# Patient Record
Sex: Male | Born: 1980 | Race: Black or African American | Hispanic: No | Marital: Single | Smoking: Never smoker
Health system: Southern US, Community
[De-identification: ages and names within clinical notes are randomized; demographics above are authoritative.]

---

## 1999-10-11 HISTORY — PX: OTHER SURGICAL HISTORY: SHX169

## 2010-02-20 ENCOUNTER — Emergency Department: Payer: Self-pay | Admitting: Emergency Medicine

## 2012-10-21 ENCOUNTER — Emergency Department: Payer: Self-pay | Admitting: Emergency Medicine

## 2017-11-20 ENCOUNTER — Encounter: Payer: Self-pay | Admitting: Emergency Medicine

## 2017-11-20 ENCOUNTER — Emergency Department: Payer: BLUE CROSS/BLUE SHIELD

## 2017-11-20 DIAGNOSIS — M23611 Other spontaneous disruption of anterior cruciate ligament of right knee: Secondary | ICD-10-CM | POA: Diagnosis not present

## 2017-11-20 DIAGNOSIS — Y999 Unspecified external cause status: Secondary | ICD-10-CM | POA: Insufficient documentation

## 2017-11-20 DIAGNOSIS — Y929 Unspecified place or not applicable: Secondary | ICD-10-CM | POA: Insufficient documentation

## 2017-11-20 DIAGNOSIS — Y9367 Activity, basketball: Secondary | ICD-10-CM | POA: Insufficient documentation

## 2017-11-20 DIAGNOSIS — X501XXA Overexertion from prolonged static or awkward postures, initial encounter: Secondary | ICD-10-CM | POA: Diagnosis not present

## 2017-11-20 DIAGNOSIS — S8991XA Unspecified injury of right lower leg, initial encounter: Secondary | ICD-10-CM | POA: Diagnosis present

## 2017-11-20 NOTE — ED Triage Notes (Signed)
Pt to triage via w/c with no distress noted; reports injuring right knee during bball game PTA; ice pack applied

## 2017-11-21 ENCOUNTER — Emergency Department
Admission: EM | Admit: 2017-11-21 | Discharge: 2017-11-21 | Disposition: A | Discharge status: Home / Self Care | Payer: BLUE CROSS/BLUE SHIELD | Attending: Emergency Medicine | Admitting: Emergency Medicine

## 2017-11-21 ENCOUNTER — Emergency Department: Payer: BLUE CROSS/BLUE SHIELD

## 2017-11-21 DIAGNOSIS — S83511A Sprain of anterior cruciate ligament of right knee, initial encounter: Secondary | ICD-10-CM

## 2017-11-21 MED ORDER — OXYCODONE-ACETAMINOPHEN 7.5-325 MG PO TABS: 1.0000 | ORAL_TABLET | ORAL | 0 refills | Status: AC | PRN

## 2017-11-21 MED ORDER — OXYCODONE-ACETAMINOPHEN 5-325 MG PO TABS
1.0000 | ORAL_TABLET | Freq: Once | ORAL | Status: AC
  Administered 2017-11-21: 1 via ORAL
  Filled 2017-11-21: qty 1

## 2017-11-21 NOTE — ED Notes (Signed)
Report off to jen rn.  

## 2017-11-21 NOTE — ED Notes (Signed)
Patient transported to MRI 

## 2017-11-21 NOTE — ED Notes (Signed)
Pt has pain behind right knee.  States injured knee tonight while playing basketball.    Ice pack applied.

## 2017-11-21 NOTE — ED Provider Notes (Signed)
[facility] Emergency Department Provider Note   First MD Initiated Contact with Patient 11/21/17 0130     (approximate)  I have reviewed the triage vital signs and the nursing notes.   HISTORY  Chief Complaint Knee Injury   HPI Tyler Bell is a 37 y.o. male presents to the emergency department with 10 out of 10 right knee pain status post basketbaLL elated injury.  Patient states that his knee was awkwardly bent backwards (hyperextension) while playing basketball tonight.  Patient states that pain is worse with any movement of the right knee or attempts at ambulation.   Past medical history  None There are no active problems to display for this patient.   Past surgical history None  Prior to Admission medications   Medication Sig Start Date End Date Taking? Authorizing Provider  oxyCODONE-acetaminophen (PERCOCET) 7.5-325 MG tablet Take 1 tablet by mouth every 4 (four) hours as needed for severe pain. 11/21/17 11/21/18  Darci CurrentBrown, [place] N, MD    Allergies No known drug allergies No family history on file.  Social History Social History   Tobacco Use  . Smoking status: Never Smoker  . Smokeless tobacco: Never Used  Substance Use Topics  . Alcohol use: Not on file  . Drug use: Not on file    Review of Systems Constitutional: No fever/chills Eyes: No visual changes. ENT: No sore throat. Cardiovascular: Denies chest pain. Respiratory: Denies shortness of breath. Gastrointestinal: No abdominal pain.  No nausea, no vomiting.  No diarrhea.  No constipation. Genitourinary: Negative for dysuria. Musculoskeletal: Negative for neck pain.  Negative for back pain.  Positive for right knee pain Integumentary: Negative for rash. Neurological: Negative for headaches, focal weakness or numbness.  Allergic/Immunilogical: **}  ____________________________________________   PHYSICAL EXAM:  VITAL SIGNS: ED Triage Vitals  Enc Vitals Group     BP  11/20/17 2139 140/89     Pulse Rate 11/20/17 2139 91     Resp 11/20/17 2139 18     Temp 11/20/17 2139 98.6 F (37 C)     Temp Source 11/20/17 2139 Oral     SpO2 11/20/17 2139 98 %     Weight 11/20/17 2140 119.7 kg (264 lb)     Height 11/20/17 2140 1.956 m (6\' 5" )     Head Circumference --      Peak Flow --      Pain Score 11/20/17 2142 10     Pain Loc --      Pain Edu? --      Excl. in GC? --     Constitutional: Alert and oriented. Well appearing and in no acute distress. Eyes: Conjunctivae are normal.  Head: Atraumatic. Mouth/Throat: Mucous membranes are moist. Oropharynx non-erythematous. Neck: No stridor.   Cardiovascular: Normal rate, regular rhythm. Good peripheral circulation. Grossly normal heart sounds. Respiratory: Normal respiratory effort.  No retractions. Lungs CTAB. Gastrointestinal: Soft and nontender. No distention.  Musculoskeletal: Pain with valgus and varus stress test of the right knee.  Positive anterior drawer test. Neurologic:  Normal speech and language. No gross focal neurologic deficits are appreciated.  Skin:  Skin is warm, dry and intact. No rash noted. Psychiatric: Mood and affect are normal. Speech and behavior are normal.   RADIOLOGY I, [place] N BROWN, personally viewed and evaluated these images (plain radiographs) as part of my medical decision making, as well as reviewing the written report by the radiologist.  ED MD interpretation: Right knee x-ray revealed no acute abnormality  Official radiology report(s): Mr Knee Right Wo Contrast  Result Date: 11/21/2017 CLINICAL DATA:  Right knee injury sustained during basketball game prior to arrival. Lateral knee pain. EXAM: MRI OF THE RIGHT KNEE WITHOUT CONTRAST TECHNIQUE: Multiplanar, multisequence MR imaging of the knee was performed. No intravenous contrast was administered. COMPARISON:  None. FINDINGS: MENISCI Medial meniscus:  Intact Lateral meniscus:  Intact LIGAMENTS Cruciates: Full-thickness  tear the anterior cruciate ligament along its midportion. Intact PCL. Collaterals: Torn LCL with wavy contour of the LCL and adjacent edema. Mild grade 1 sprain of the MCL, slightly thickened in appearance with overlying edema. CARTILAGE Patellofemoral:  No chondral defect. Medial:  Intact Lateral:  Intact Joint: No significant joint effusion. Normal Hoffa's fat pad. No plical thickening. Popliteal Fossa: No significant popliteal fluid collections. Intact popliteus. Extensor Mechanism:  Intact quadriceps tendon and patellar tendon. Bones:  No fracture or significant bone contusion. Other: Subcutaneous soft tissue edema about the knee. IMPRESSION: 1. Full-thickness tear of the ACL and LCL. 2. Grade 1 sprain of the MCL. 3. Intact menisci.  No focal chondral defects. Electronically Signed   By: Tollie Eth M.D.   On: 11/21/2017 03:12   Dg Knee Complete 4 Views Right  Result Date: 11/20/2017 CLINICAL DATA:  Hyperextension injury while playing basketball, with acute onset of right posterior knee pain. Initial encounter. EXAM: RIGHT KNEE - COMPLETE 4+ VIEW COMPARISON:  None. FINDINGS: There is no evidence of fracture or dislocation. The joint spaces are preserved. Minimal wall osteophyte formation is noted; the patellofemoral joint is grossly unremarkable in appearance. An enthesophyte is seen arising at the upper pole of the patella. No significant joint effusion is seen. The visualized soft tissues are normal in appearance. IMPRESSION: No evidence of fracture or dislocation. Electronically Signed   By: Roanna Raider M.D.   On: 11/20/2017 22:20     Procedures   ____________________________________________   INITIAL IMPRESSION / ASSESSMENT AND PLAN / ED COURSE  As part of my medical decision making, I reviewed the following data within the electronic MEDICAL RECORD [id number] year old male presenting to the emergency department above-stated history basketball injury.  Physical exam concerning for ligamentous  injury of the right and as such in the was performed which revealed a full-thickness tear to the right ACL as well as an MCL grade 1 sprain.  Knee immobilizer applied to the right knee, crutches given.  Patient given Percocet.  Spoke with patient at length regarding the necessity of following up with orthopedic surgeon.  Patient will be referred to Dr. Odis Luster ____________________________________________  FINAL CLINICAL IMPRESSION(S) / ED DIAGNOSES  Final diagnoses:  Rupture of anterior cruciate ligament of right knee, initial encounter     MEDICATIONS GIVEN DURING THIS VISIT:  Medications  oxyCODONE-acetaminophen (PERCOCET/ROXICET) 5-325 MG per tablet 1 tablet (1 tablet Oral Given 11/21/17 0122)     ED Discharge Orders        Ordered    oxyCODONE-acetaminophen (PERCOCET) 7.5-325 MG tablet  Every 4 hours PRN     11/21/17 0338       Note:  This document was prepared using Dragon voice recognition software and may include unintentional dictation errors.    Darci Current, MD 11/21/17 [phone]

## 2018-06-21 IMAGING — MR MR KNEE*R* W/O CM
6 series · 37 of 40 positions shown · non-contrast
Comparison: None.

CLINICAL DATA: Right knee injury sustained during basketball game
prior to arrival. Lateral knee pain.

EXAM:
MRI OF THE RIGHT KNEE WITHOUT CONTRAST
TECHNIQUE: Multiplanar, multisequence MR imaging of the knee was performed. No
intravenous contrast was administered.

[Series 3: PD fat-sat · axial · 3.0mm · 0.50mm/px · z∈[-93,+26]mm · 7 of 37 slices shown (1 of 4)]
[im 1/37]
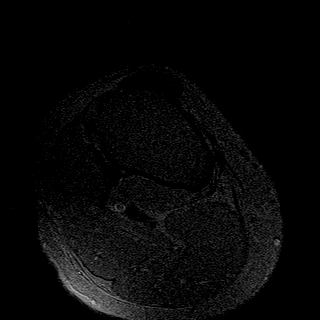
[im 7/37]
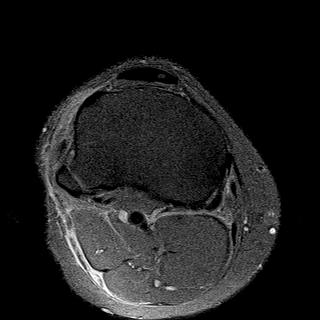
[im 13/37]
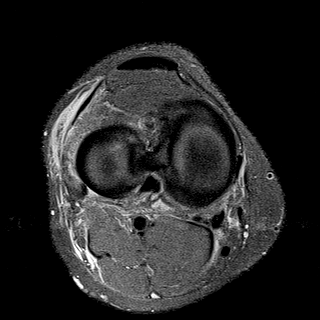
[im 19/37]
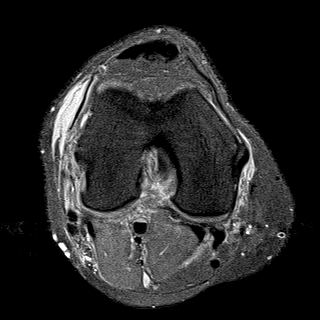
[im 25/37]
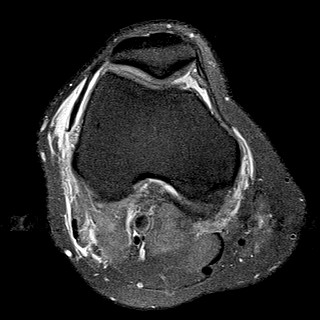
[im 31/37]
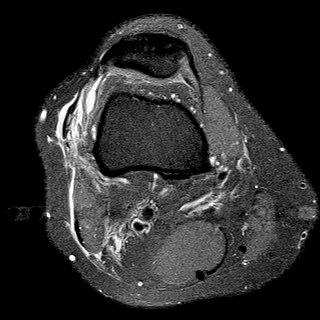
[im 37/37]
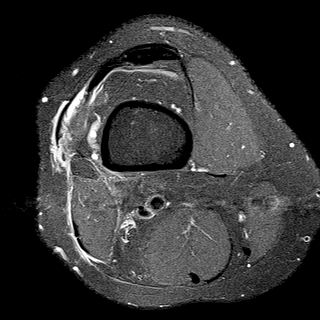

[Series 4: T1 · coronal · 3.0mm · 0.50mm/px · 4 of 30 slices shown]
[im 1/30]
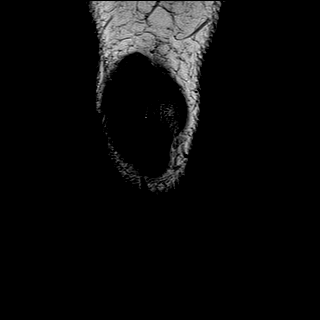
[im 5/30]
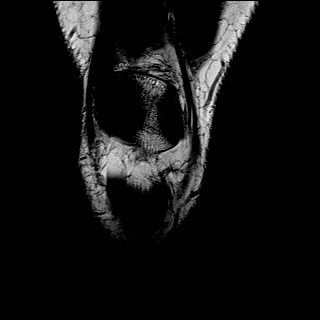
[im 10/30]
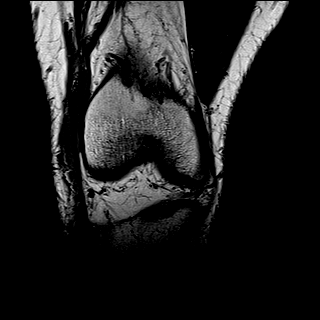
[im 15/30]
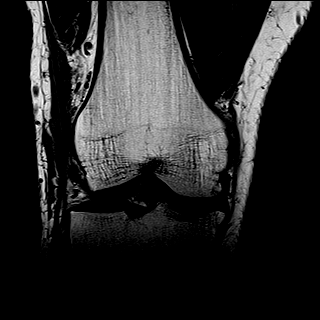

[Series 5: T2 fat-sat · coronal · 3.0mm · 0.31mm/px · 7 of 30 slices shown]
[im 1/30]
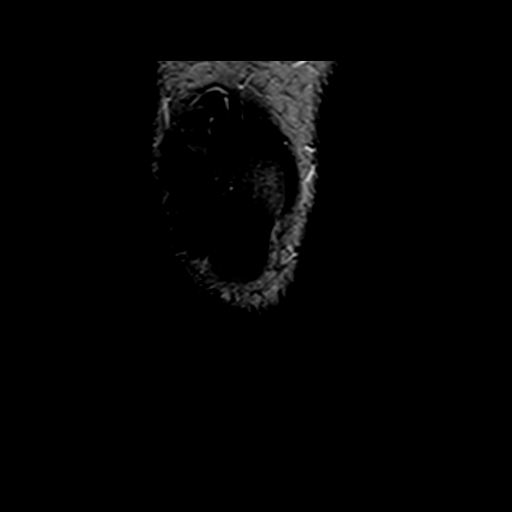
[im 5/30]
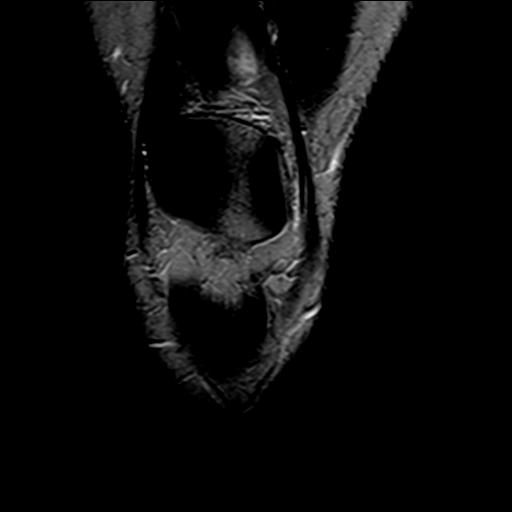
[im 10/30]
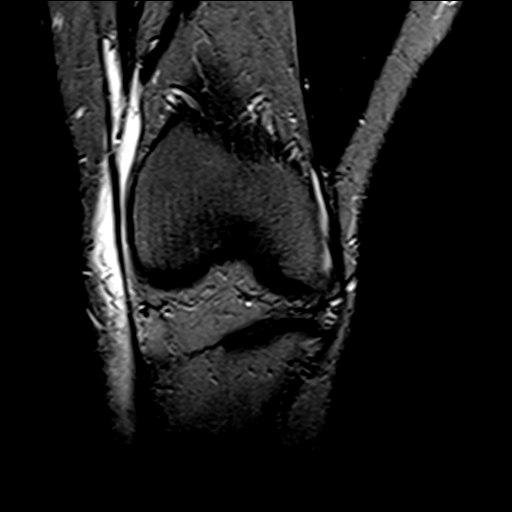
[im 15/30]
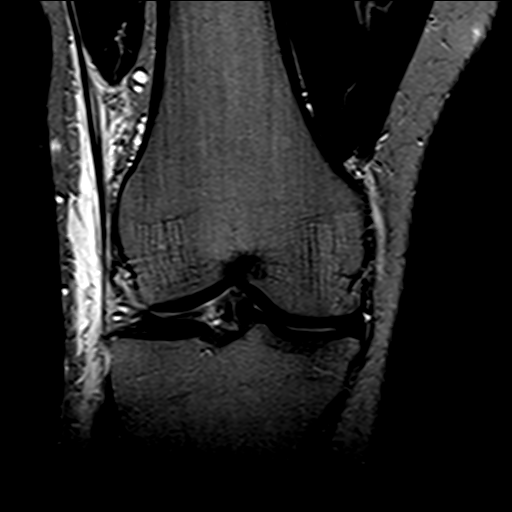
[im 20/30]
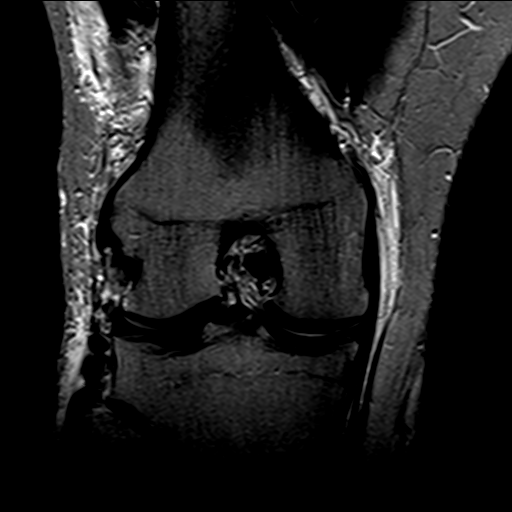
[im 25/30]
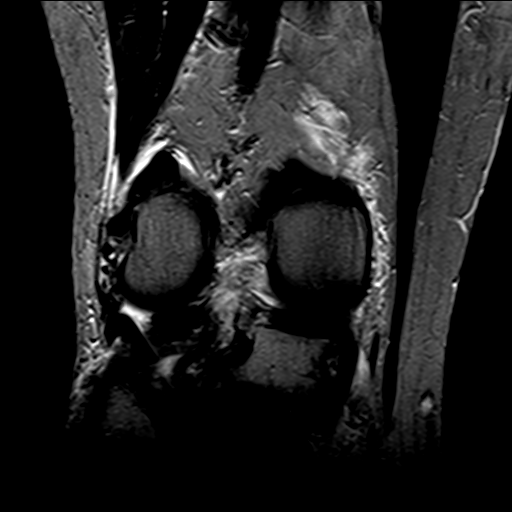
[im 30/30]
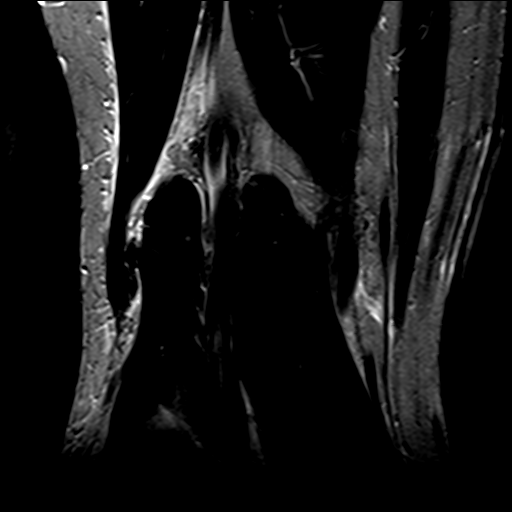

[Series 6: PD fat-sat · coronal · 3.0mm · 0.50mm/px · 7 of 30 slices shown (2 of 4)]
[im 1/30]
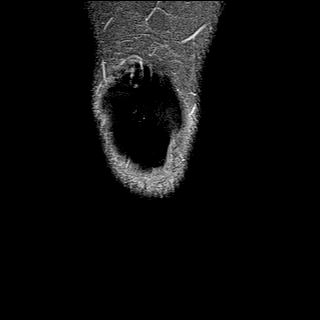
[im 5/30]
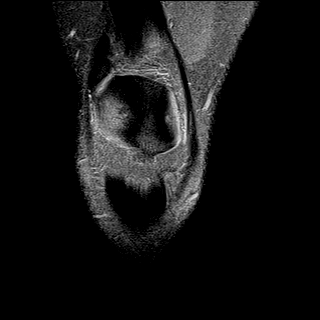
[im 10/30]
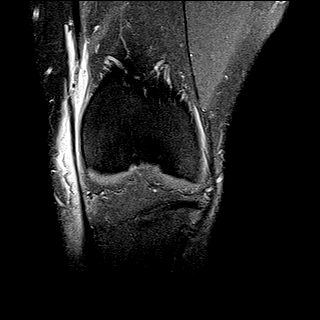
[im 15/30]
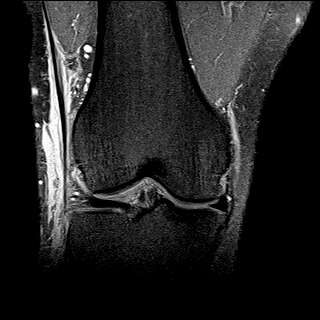
[im 20/30]
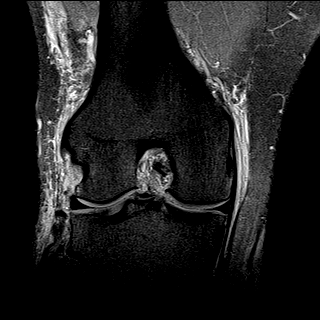
[im 25/30]
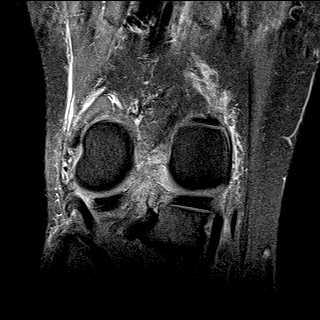
[im 30/30]
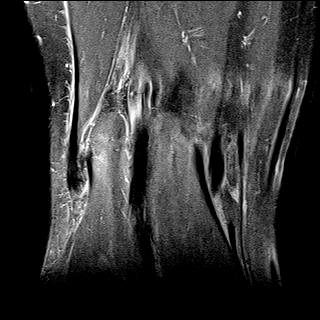

[Series 8: PD fat-sat · axial · 2.0mm · 0.62mm/px · z∈[-100,-71]mm · 4 of 17 slices shown (3 of 4)]
[im 1/17]
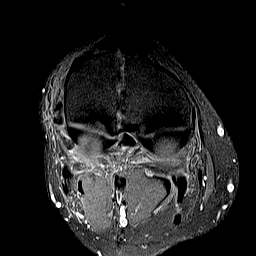
[im 6/17]
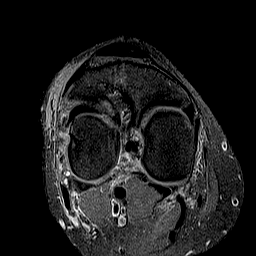
[im 11/17]
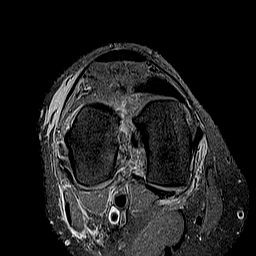
[im 17/17]
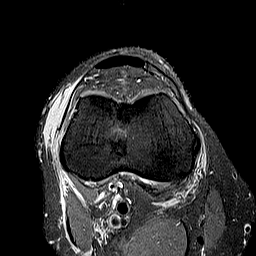

[Series 9: PD fat-sat · sagittal · 3.0mm · 0.50mm/px · 8 of 35 slices shown (4 of 4)]
[im 1/35]
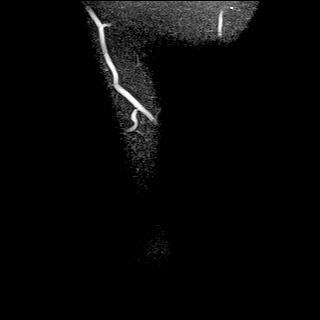
[im 5/35]
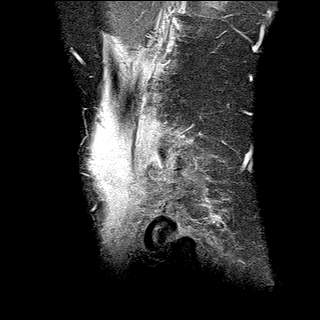
[im 10/35]
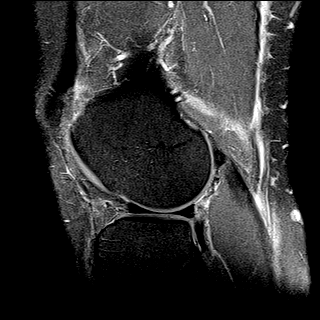
[im 15/35]
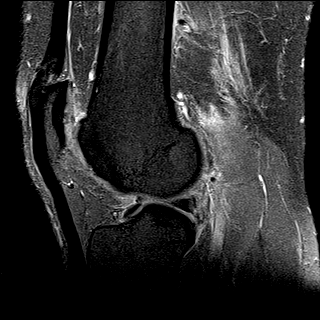
[im 20/35]
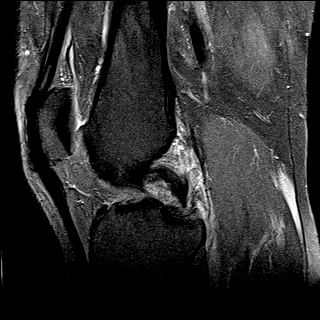
[im 25/35]
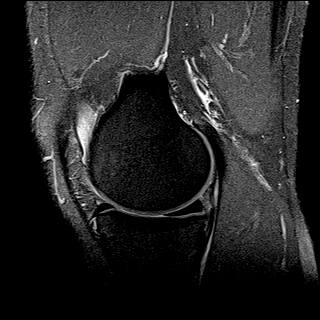
[im 30/35]
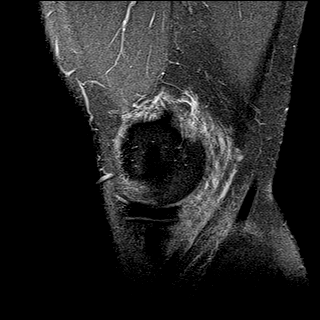
[im 35/35]
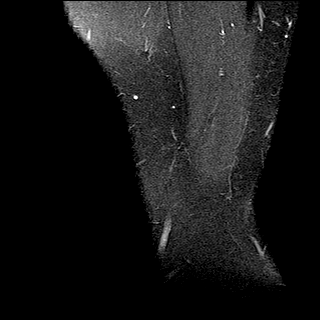

[37 of 40 positions shown; findings below may reference images not displayed]

FINDINGS: MENISCI

Medial meniscus:  Intact

Lateral meniscus:  Intact

LIGAMENTS

Cruciates: Full-thickness tear the anterior cruciate ligament along
its midportion. Intact PCL.

Collaterals: Torn LCL with wavy contour of the LCL and adjacent
edema. Mild grade 1 sprain of the MCL, slightly thickened in
appearance with overlying edema.

CARTILAGE

Patellofemoral:  No chondral defect.

Medial:  Intact

Lateral:  Intact

Joint: No significant joint effusion. Normal Hoffa's fat pad. No
plical thickening.

Popliteal Fossa: No significant popliteal fluid collections. Intact
popliteus.

Extensor Mechanism:  Intact quadriceps tendon and patellar tendon.

Bones:  No fracture or significant bone contusion.

Other: Subcutaneous soft tissue edema about the knee.
IMPRESSION: 1. Full-thickness tear of the ACL and LCL.
2. Grade 1 sprain of the MCL.
3. Intact menisci.  No focal chondral defects.

## 2019-05-28 ENCOUNTER — Other Ambulatory Visit: Payer: Self-pay

## 2019-05-28 DIAGNOSIS — Z20822 Contact with and (suspected) exposure to covid-19: Secondary | ICD-10-CM

## 2019-05-29 LAB — NOVEL CORONAVIRUS, NAA: SARS-CoV-2, NAA: NOT DETECTED

## 2021-03-04 ENCOUNTER — Ambulatory Visit: Payer: Self-pay

## 2021-03-04 ENCOUNTER — Other Ambulatory Visit: Payer: Self-pay

## 2021-03-04 DIAGNOSIS — Z0000 Encounter for general adult medical examination without abnormal findings: Secondary | ICD-10-CM

## 2021-03-04 LAB — POCT URINALYSIS DIPSTICK
Bilirubin, UA: NEGATIVE
Blood, UA: NEGATIVE
Glucose, UA: NEGATIVE
Ketones, UA: NEGATIVE
Leukocytes, UA: NEGATIVE
Nitrite, UA: NEGATIVE
Protein, UA: NEGATIVE
Spec Grav, UA: >=1.03 — AB (ref 1.010–1.025)
Urobilinogen, UA: 0.2 E.U./dL
pH, UA: 5.5 (ref 5.0–8.0)

## 2021-03-04 NOTE — Progress Notes (Signed)
Scheduled to complete physical 03/11/21 with Adam Scarboro, FNP.  AMD 

## 2021-03-05 LAB — CMP12+LP+TP+TSH+6AC+PSA+CBC…
ALT: 121 IU/L — ABNORMAL HIGH (ref 0–44)
AST: 63 IU/L — ABNORMAL HIGH (ref 0–40)
Albumin/Globulin Ratio: 2.1 (ref 1.2–2.2)
Albumin: 5 g/dL (ref 4.0–5.0)
Alkaline Phosphatase: 68 IU/L (ref 44–121)
BUN/Creatinine Ratio: 9 (ref 9–20)
BUN: 12 mg/dL (ref 6–24)
Basophils Absolute: 0 10*3/uL (ref 0.0–0.2)
Basos: 1 %
Bilirubin Total: 0.5 mg/dL (ref 0.0–1.2)
Calcium: 9.9 mg/dL (ref 8.7–10.2)
Chloride: 100 mmol/L (ref 96–106)
Chol/HDL Ratio: 6.4 ratio — ABNORMAL HIGH (ref 0.0–5.0)
Cholesterol, Total: 299 mg/dL — ABNORMAL HIGH (ref 100–199)
Creatinine, Ser: 1.41 mg/dL — ABNORMAL HIGH (ref 0.76–1.27)
EOS (ABSOLUTE): 0.1 10*3/uL (ref 0.0–0.4)
Eos: 2 %
Estimated CHD Risk: 1.3 times avg. — ABNORMAL HIGH (ref 0.0–1.0)
Free Thyroxine Index: 1.7 (ref 1.2–4.9)
GGT: 74 IU/L — ABNORMAL HIGH (ref 0–65)
Globulin, Total: 2.4 g/dL (ref 1.5–4.5)
Glucose: 108 mg/dL — ABNORMAL HIGH (ref 65–99)
HDL: 47 mg/dL (ref >39)
Hematocrit: 42 % (ref 37.5–51.0)
Hemoglobin: 14.2 g/dL (ref 13.0–17.7)
Immature Grans (Abs): 0 10*3/uL (ref 0.0–0.1)
Immature Granulocytes: 0 %
Iron: 83 ug/dL (ref 38–169)
LDH: 253 IU/L — ABNORMAL HIGH (ref 121–224)
LDL Chol Calc (NIH): 228 mg/dL — ABNORMAL HIGH (ref 0–99)
Lymphocytes Absolute: 2.7 10*3/uL (ref 0.7–3.1)
Lymphs: 50 %
MCH: 29.2 pg (ref 26.6–33.0)
MCHC: 33.8 g/dL (ref 31.5–35.7)
MCV: 86 fL (ref 79–97)
Monocytes Absolute: 0.5 10*3/uL (ref 0.1–0.9)
Monocytes: 10 %
Neutrophils Absolute: 2 10*3/uL (ref 1.4–7.0)
Neutrophils: 37 %
Phosphorus: 4.5 mg/dL — ABNORMAL HIGH (ref 2.8–4.1)
Platelets: 185 10*3/uL (ref 150–450)
Potassium: 4.3 mmol/L (ref 3.5–5.2)
Prostate Specific Ag, Serum: 1.2 ng/mL (ref 0.0–4.0)
RBC: 4.86 x10E6/uL (ref 4.14–5.80)
RDW: 14 % (ref 11.6–15.4)
Sodium: 139 mmol/L (ref 134–144)
T3 Uptake Ratio: 25 % (ref 24–39)
T4, Total: 6.8 ug/dL (ref 4.5–12.0)
TSH: 1.5 u[IU]/mL (ref 0.450–4.500)
Total Protein: 7.4 g/dL (ref 6.0–8.5)
Triglycerides: 132 mg/dL (ref 0–149)
Uric Acid: 7.4 mg/dL (ref 3.8–8.4)
VLDL Cholesterol Cal: 24 mg/dL (ref 5–40)
WBC: 5.3 10*3/uL (ref 3.4–10.8)
eGFR: 64 mL/min/{1.73_m2} (ref >59)

## 2021-03-11 ENCOUNTER — Other Ambulatory Visit: Payer: Self-pay

## 2021-03-11 ENCOUNTER — Ambulatory Visit: Payer: Self-pay | Admitting: Adult Health

## 2021-03-11 ENCOUNTER — Encounter: Payer: Self-pay | Admitting: Adult Health

## 2021-03-11 VITALS — BP 136/95 | HR 101 | Temp 97.6°F | Resp 16 | Ht 77.0 in | Wt 262.0 lb

## 2021-03-11 DIAGNOSIS — E7800 Pure hypercholesterolemia, unspecified: Secondary | ICD-10-CM

## 2021-03-11 DIAGNOSIS — Z0000 Encounter for general adult medical examination without abnormal findings: Secondary | ICD-10-CM

## 2021-03-11 DIAGNOSIS — R7989 Other specified abnormal findings of blood chemistry: Secondary | ICD-10-CM

## 2021-03-11 DIAGNOSIS — R7309 Other abnormal glucose: Secondary | ICD-10-CM

## 2021-03-11 LAB — POCT GLYCOSYLATED HEMOGLOBIN (HGB A1C): HbA1c POC (<> result, manual entry): 4.6 % (ref 4.0–5.6)

## 2021-03-11 NOTE — Progress Notes (Signed)
[facility] [place], [place] [address]  Internal MEDICINE  Office Visit Note  Patient Name: Tyler Bell  [id number]  [id number]  Date of Service: 03/11/2021  Chief Complaint  Patient presents with  . Annual Exam    Pt denies any issues or concerns at this time. CL,RMA     HPI Pt is here for routine health maintenance examination. He is a 40 yo AA male who works for the [employer] for the past 16 years.  He is currently single, and has two children.  A 77 year old son, and a 73 year old daughter. He denies tobacco and illicit drug use.  He drinks alcohol occasionally.  His medical surgical history is reviewed and is unremarkable at this time.      Current Medication: No outpatient encounter medications on file as of 03/11/2021.   No facility-administered encounter medications on file as of 03/11/2021.    Surgical History: Past Surgical History:  Procedure Laterality Date  . pinky surgery  2001    Medical History: History reviewed. No pertinent past medical history.  Family History: History reviewed. No pertinent family history.  Social History: Social History   Socioeconomic History  . Marital status: Single    Spouse name: Not on file  . Number of children: Not on file  . Years of education: Not on file  . Highest education level: Not on file  Occupational History  . Not on file  Tobacco Use  . Smoking status: Never Smoker  . Smokeless tobacco: Never Used  Substance and Sexual Activity  . Alcohol use: Yes    Comment: occasionally   . Drug use: Not on file  . Sexual activity: Not on file  Other Topics Concern  . Not on file  Social History Narrative  . Not on file   Social Determinants of Health   Financial Resource Strain: Not on file  Food Insecurity: Not on file  Transportation Needs: Not on file  Physical Activity: Not on file  Stress: Not on file  Social Connections: Not on file      Review of Systems   Constitutional: Negative for activity change, appetite change and fatigue.  HENT: Negative for congestion, sinus pain, trouble swallowing and voice change.   Eyes: Negative for pain, discharge and visual disturbance.  Respiratory: Negative for cough, chest tightness and shortness of breath.   Cardiovascular: Negative for chest pain and leg swelling.  Gastrointestinal: Negative for abdominal distention, abdominal pain, constipation and diarrhea.  Endocrine: Negative for cold intolerance and heat intolerance.  Genitourinary: Negative for difficulty urinating, flank pain, scrotal swelling and testicular pain.  Musculoskeletal: Negative for arthralgias, back pain and neck pain.  Skin: Negative for color change.  Neurological: Negative for dizziness, weakness and headaches.  Hematological: Negative for adenopathy.  Psychiatric/Behavioral: Negative for agitation, confusion and suicidal ideas.     Vital Signs: BP (!) 136/95   Pulse (!) 101   Temp 97.6 F (36.4 C)   Resp 16   Ht _0  (1.956 m)   Wt 262 lb (118.8 kg)   SpO2 98%   BMI 31.07 kg/m    Physical Exam Constitutional:      Appearance: Normal appearance.  HENT:     Head: Normocephalic.     Right Ear: Tympanic membrane normal.     Left Ear: Tympanic membrane normal.     Nose: Nose normal.     Mouth/Throat:     Mouth: Mucous  membranes are moist.     Pharynx: No oropharyngeal exudate or posterior oropharyngeal erythema.  Eyes:     General:        Right eye: No discharge.        Left eye: No discharge.     Extraocular Movements: Extraocular movements intact.     Pupils: Pupils are equal, round, and reactive to light.  Cardiovascular:     Rate and Rhythm: Normal rate and regular rhythm.     Pulses: Normal pulses.     Heart sounds: Normal heart sounds. No murmur heard.   Pulmonary:     Effort: Pulmonary effort is normal. No respiratory distress.     Breath sounds: Normal breath sounds. No wheezing or rhonchi.   Abdominal:     General: Abdomen is flat. Bowel sounds are normal. There is no distension.     Palpations: There is no mass.     Tenderness: There is no abdominal tenderness. There is no guarding.     Hernia: No hernia is present.  Musculoskeletal:        General: No swelling or deformity. Normal range of motion.     Cervical back: Normal range of motion.  Skin:    General: Skin is warm and dry.     Capillary Refill: Capillary refill takes less than 2 seconds.  Neurological:     General: No focal deficit present.     Mental Status: He is alert.     Cranial Nerves: No cranial nerve deficit.     Gait: Gait normal.  Psychiatric:        Mood and Affect: Mood normal.        Behavior: Behavior normal.        Judgment: Judgment normal.      LABS: Recent Results (from the past 2160 hour(s))  CMP12+LP+TP+TSH+6AC+PSA+CBC.     Status: Abnormal   Collection Time: 03/04/21 10:00 AM  Result Value Ref Range   Glucose 108 (H) 65 - 99 mg/dL   Uric Acid 7.4 3.8 - 8.4 mg/dL    Comment:            Therapeutic target for gout patients: <6.0   BUN 12 6 - 24 mg/dL   Creatinine, Ser 1.41 (H) 0.76 - 1.27 mg/dL   eGFR 64 >59 mL/min/1.73   BUN/Creatinine Ratio 9 9 - 20   Sodium 139 134 - 144 mmol/L   Potassium 4.3 3.5 - 5.2 mmol/L   Chloride 100 96 - 106 mmol/L   Calcium 9.9 8.7 - 10.2 mg/dL   Phosphorus 4.5 (H) 2.8 - 4.1 mg/dL   Total Protein 7.4 6.0 - 8.5 g/dL   Albumin 5.0 4.0 - 5.0 g/dL   Globulin, Total 2.4 1.5 - 4.5 g/dL   Albumin/Globulin Ratio 2.1 1.2 - 2.2   Bilirubin Total 0.5 0.0 - 1.2 mg/dL   Alkaline Phosphatase 68 44 - 121 IU/L   LDH 253 (H) 121 - 224 IU/L   AST 63 (H) 0 - 40 IU/L   ALT 121 (H) 0 - 44 IU/L   GGT 74 (H) 0 - 65 IU/L   Iron 83 38 - 169 ug/dL   Cholesterol, Total 299 (H) 100 - 199 mg/dL   Triglycerides 132 0 - 149 mg/dL   HDL 47 >39 mg/dL   VLDL Cholesterol Cal 24 5 - 40 mg/dL   LDL Chol Calc (NIH) 228 (H) 0 - 99 mg/dL   Chol/HDL Ratio 6.4 (H) 0.0 - 5.0 ratio  Comment:                                   T. Chol/HDL Ratio                                             Men  Women                               1/2 Avg.Risk  3.4    3.3                                   Avg.Risk  5.0    4.4                                2X Avg.Risk  9.6    7.1                                3X Avg.Risk 23.4   11.0    Estimated CHD Risk 1.3 (H) 0.0 - 1.0 times avg.    Comment: The CHD Risk is based on the T. Chol/HDL ratio. Other factors affect CHD Risk such as hypertension, smoking, diabetes, severe obesity, and family history of premature CHD.    TSH 1.500 0.450 - 4.500 uIU/mL   T4, Total 6.8 4.5 - 12.0 ug/dL   T3 Uptake Ratio 25 24 - 39 %   Free Thyroxine Index 1.7 1.2 - 4.9   Prostate Specific Ag, Serum 1.2 0.0 - 4.0 ng/mL    Comment: Roche ECLIA methodology. According to the American Urological Association, Serum PSA should decrease and remain at undetectable levels after radical prostatectomy. The AUA defines biochemical recurrence as an initial PSA value 0.2 ng/mL or greater followed by a subsequent confirmatory PSA value 0.2 ng/mL or greater. Values obtained with different assay methods or kits cannot be used interchangeably. Results cannot be interpreted as absolute evidence of the presence or absence of malignant disease.    WBC 5.3 3.4 - 10.8 x10E3/uL   RBC 4.86 4.14 - 5.80 x10E6/uL   Hemoglobin 14.2 13.0 - 17.7 g/dL   Hematocrit 42.0 37.5 - 51.0 %   MCV 86 79 - 97 fL   MCH 29.2 26.6 - 33.0 pg   MCHC 33.8 31.5 - 35.7 g/dL   RDW 14.0 11.6 - 15.4 %   Platelets 185 150 - 450 x10E3/uL   Neutrophils 37 Not Estab. %   Lymphs 50 Not Estab. %   Monocytes 10 Not Estab. %   Eos 2 Not Estab. %   Basos 1 Not Estab. %   Neutrophils Absolute 2.0 1.4 - 7.0 x10E3/uL   Lymphocytes Absolute 2.7 0.7 - 3.1 x10E3/uL   Monocytes Absolute 0.5 0.1 - 0.9 x10E3/uL   EOS (ABSOLUTE) 0.1 0.0 - 0.4 x10E3/uL   Basophils Absolute 0.0 0.0 - 0.2 x10E3/uL   Immature  Granulocytes 0 Not Estab. %   Immature Grans (Abs) 0.0 0.0 - 0.1 x10E3/uL  POCT urinalysis dipstick     Status: Abnormal   Collection Time: 03/04/21 11:15 AM  Result Value Ref Range   Color, UA yellow    Clarity, UA clear    Glucose, UA Negative Negative   Bilirubin, UA negative    Ketones, UA negative    Spec Grav, UA >=1.030 (A) 1.010 - 1.025   Blood, UA negative    pH, UA 5.5 5.0 - 8.0   Protein, UA Negative Negative   Urobilinogen, UA 0.2 0.2 or 1.0 E.U./dL   Nitrite, UA negative    Leukocytes, UA Negative Negative   Appearance dark    Odor      Assessment/Plan: 1. Annual physical exam Up to date on PHM. Glucose was mildly elevated on fasting labs, A1C was done in office and found to be 4.6.    2. Elevated LFTs LFT's are Elevated, patient does report some OTC supplement use for exercise.  However he has not had any in over a month. Liver does not feel enlarged, however a [place] is warranted at this time.  - US Abdomen Limited RUQ (LIVER/GB); Future  3. Elevated cholesterol Discussed elevated cholesterol, and discussed lifestyle modifications since patient is adamant that he does not want to start medications.  He will increase his exercise, and work on a healthier diet.  Return to clinic in 4-6 months for recheck of cholesterol.    General Counseling: Tyler Bell verbalizes understanding of the findings of todays visit and agrees with plan of treatment. I have discussed any further diagnostic evaluation that may be needed or ordered today. We also reviewed his medications today. he has been encouraged to call the office with any questions or concerns that should arise related to todays visit.    Orders Placed This Encounter  Procedures  . US Abdomen Limited RUQ (LIVER/GB)    No orders of the defined types were placed in this encounter.   Total time spent: 40 Minutes  Time spent includes review of chart, medications, test results, and follow up plan with the patient.     Tyler Bell AGNP-C Nurse Practitioner

## 2021-03-15 ENCOUNTER — Telehealth: Payer: Self-pay

## 2021-03-15 NOTE — Telephone Encounter (Signed)
Tyler Bell (Admin Asst) tried to call [facility] with [place] appt information.  No answer & no voice mail to leave a call back message.  AMD

## 2021-03-16 NOTE — Telephone Encounter (Signed)
Tried to call [place] at [phone] on 03/15/21 - no answer & no voice mailbox set up.  Unable to leave a call back message.  Sent email to [facility] requesting he call [facility] to get appt info & details.  Tried to call [place] at [phone] on 03/16/21 at 8:55 am - no answer & no voice mailbox set up.  Unable to leave a call back message.  Doesn't have MyChart set-up.  J Wellons (Admin Asst) tried to call [place] 2x.  She also states he told her he doesn't check his city emails.  AMD

## 2021-04-07 ENCOUNTER — Ambulatory Visit: Payer: 59

## 2022-04-01 ENCOUNTER — Other Ambulatory Visit: Payer: Self-pay
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
## 2022-04-05 ENCOUNTER — Ambulatory Visit: Payer: Self-pay

## 2022-04-05 DIAGNOSIS — Z0000 Encounter for general adult medical examination without abnormal findings: Secondary | ICD-10-CM

## 2022-04-06 LAB — CMP12+LP+TP+TSH+6AC+PSA+CBC…
ALT: 51 IU/L — ABNORMAL HIGH (ref 0–44)
AST: 40 IU/L (ref 0–40)
Albumin/Globulin Ratio: 2.2 (ref 1.2–2.2)
Albumin: 5 g/dL (ref 4.0–5.0)
Alkaline Phosphatase: 56 IU/L (ref 44–121)
BUN/Creatinine Ratio: 9 (ref 9–20)
BUN: 12 mg/dL (ref 6–24)
Basophils Absolute: 0 10*3/uL (ref 0.0–0.2)
Basos: 1 %
Bilirubin Total: 1 mg/dL (ref 0.0–1.2)
Calcium: 10 mg/dL (ref 8.7–10.2)
Chloride: 98 mmol/L (ref 96–106)
Chol/HDL Ratio: 5.1 ratio — ABNORMAL HIGH (ref 0.0–5.0)
Cholesterol, Total: 228 mg/dL — ABNORMAL HIGH (ref 100–199)
Creatinine, Ser: 1.41 mg/dL — ABNORMAL HIGH (ref 0.76–1.27)
EOS (ABSOLUTE): 0.1 10*3/uL (ref 0.0–0.4)
Eos: 1 %
Estimated CHD Risk: 1 times avg. (ref 0.0–1.0)
Free Thyroxine Index: 1.7 (ref 1.2–4.9)
GGT: 73 IU/L — ABNORMAL HIGH (ref 0–65)
Globulin, Total: 2.3 g/dL (ref 1.5–4.5)
Glucose: 107 mg/dL — ABNORMAL HIGH (ref 70–99)
HDL: 45 mg/dL (ref >39)
Hematocrit: 43.3 % (ref 37.5–51.0)
Hemoglobin: 14.9 g/dL (ref 13.0–17.7)
Immature Grans (Abs): 0 10*3/uL (ref 0.0–0.1)
Immature Granulocytes: 0 %
Iron: 120 ug/dL (ref 38–169)
LDH: 242 IU/L — ABNORMAL HIGH (ref 121–224)
LDL Chol Calc (NIH): 142 mg/dL — ABNORMAL HIGH (ref 0–99)
Lymphocytes Absolute: 2.6 10*3/uL (ref 0.7–3.1)
Lymphs: 48 %
MCH: 29 pg (ref 26.6–33.0)
MCHC: 34.4 g/dL (ref 31.5–35.7)
MCV: 84 fL (ref 79–97)
Monocytes Absolute: 0.5 10*3/uL (ref 0.1–0.9)
Monocytes: 10 %
Neutrophils Absolute: 2.1 10*3/uL (ref 1.4–7.0)
Neutrophils: 40 %
Phosphorus: 4.4 mg/dL — ABNORMAL HIGH (ref 2.8–4.1)
Platelets: 197 10*3/uL (ref 150–450)
Potassium: 3.9 mmol/L (ref 3.5–5.2)
Prostate Specific Ag, Serum: 1.5 ng/mL (ref 0.0–4.0)
RBC: 5.13 x10E6/uL (ref 4.14–5.80)
RDW: 14.6 % (ref 11.6–15.4)
Sodium: 141 mmol/L (ref 134–144)
T3 Uptake Ratio: 24 % (ref 24–39)
T4, Total: 7.1 ug/dL (ref 4.5–12.0)
TSH: 2.8 u[IU]/mL (ref 0.450–4.500)
Total Protein: 7.3 g/dL (ref 6.0–8.5)
Triglycerides: 228 mg/dL — ABNORMAL HIGH (ref 0–149)
Uric Acid: 8.3 mg/dL (ref 3.8–8.4)
VLDL Cholesterol Cal: 41 mg/dL — ABNORMAL HIGH (ref 5–40)
WBC: 5.2 10*3/uL (ref 3.4–10.8)
eGFR: 63 mL/min/{1.73_m2} (ref >59)

## 2022-04-07 ENCOUNTER — Ambulatory Visit: Payer: Self-pay | Admitting: Physician Assistant

## 2022-04-07 ENCOUNTER — Encounter: Payer: Self-pay | Admitting: Physician Assistant

## 2022-04-07 VITALS — BP 153/120 | HR 88 | Temp 98.0°F | Resp 16 | Ht 77.0 in | Wt 259.0 lb

## 2022-04-07 DIAGNOSIS — E7800 Pure hypercholesterolemia, unspecified: Secondary | ICD-10-CM

## 2022-04-07 DIAGNOSIS — H6122 Impacted cerumen, left ear: Secondary | ICD-10-CM

## 2022-04-07 DIAGNOSIS — Z0000 Encounter for general adult medical examination without abnormal findings: Secondary | ICD-10-CM

## 2022-04-07 DIAGNOSIS — R030 Elevated blood-pressure reading, without diagnosis of hypertension: Secondary | ICD-10-CM

## 2022-04-07 LAB — POCT URINALYSIS DIPSTICK
Bilirubin, UA: NEGATIVE
Blood, UA: NEGATIVE
Glucose, UA: NEGATIVE
Ketones, UA: NEGATIVE
Leukocytes, UA: NEGATIVE
Nitrite, UA: NEGATIVE
Protein, UA: POSITIVE — AB
Spec Grav, UA: 1.025 (ref 1.010–1.025)
Urobilinogen, UA: 0.2 E.U./dL
pH, UA: 6 (ref 5.0–8.0)

## 2022-04-07 NOTE — Progress Notes (Signed)
[facility]  ____________________________________________   None    (approximate)  I have reviewed the triage vital signs and the nursing notes.   HISTORY  Chief Complaint No chief complaint on file.    HPI Tyler Bell is a 41 y.o. male patient presents for annual physical exam.  Patient complaining of left ear pain and decreased hearing for 3 to 4 days.  Denies vertigo.         No past medical history on file.  There are no problems to display for this patient.   Past Surgical History:  Procedure Laterality Date   pinky surgery  2001    Prior to Admission medications   Not on File    Allergies Patient has no known allergies.  No family history on file.  Social History Social History   Tobacco Use   Smoking status: Never   Smokeless tobacco: Never  Substance Use Topics   Alcohol use: Yes    Comment: occasionally     Review of Systems Constitutional: No fever/chills Eyes: No visual changes. ENT: No sore throat.  Decreased hearing left ear Cardiovascular: Denies chest pain. Respiratory: Denies shortness of breath. Gastrointestinal: No abdominal pain.  No nausea, no vomiting.  No diarrhea.  No constipation. Genitourinary: Negative for dysuria. Musculoskeletal: Negative for back pain. Skin: Negative for rash. Neurological: Negative for headaches, focal weakness or numbness. Endocrine: Hyperlipidemia  ____________________________________________   PHYSICAL EXAM:  VITAL SIGNS: BP is 153/120, pulse 88, respirations 16, temperature 98, patient is 98% O2 sat on room air.  Patient weighs 2 at 59 pounds and BMI is 30.71. Constitutional: Alert and oriented. Well appearing and in no acute distress. Eyes: Conjunctivae are normal. PERRL. EOMI. EARS: Cerumen impaction left ear. Head: Atraumatic. Nose: No congestion/rhinnorhea. Mouth/Throat: Mucous membranes are moist.  Oropharynx non-erythematous. Neck: No stridor.   No cervical spine tenderness to palpation. Hematological/Lymphatic/Immunilogical: No cervical lymphadenopathy. Cardiovascular: Elevated blood pressure.  Normal rate, regular rhythm. Grossly normal heart sounds.  Good peripheral circulation. Respiratory: Normal respiratory effort.  No retractions. Lungs CTAB. Gastrointestinal: Soft and nontender. No distention. No abdominal bruits. No CVA tenderness. Genitourinary: Deferred Musculoskeletal: No lower extremity tenderness nor edema.  No joint effusions. Neurologic:  Normal speech and language. No gross focal neurologic deficits are appreciated. No gait instability. Skin:  Skin is warm, dry and intact. No rash noted. Psychiatric: Mood and affect are normal. Speech and behavior are normal.  ____________________________________________   LABS      Component Ref Range & Units 2 d ago 1 yr ago  Glucose 70 - 99 mg/dL 107 High   108 High  R   Uric Acid 3.8 - 8.4 mg/dL 8.3  7.4 CM   Comment:            Therapeutic target for gout patients: <6.0  BUN 6 - 24 mg/dL 12  12   Creatinine, Ser 0.76 - 1.27 mg/dL 1.41 High   1.41 High    eGFR >59 mL/min/1.73 63  64   BUN/Creatinine Ratio 9 - '20 9  9   ' Sodium 134 - 144 mmol/L 141  139   Potassium 3.5 - 5.2 mmol/L 3.9  4.3   Chloride 96 - 106 mmol/L 98  100   Calcium 8.7 - 10.2 mg/dL 10.0  9.9   Phosphorus 2.8 - 4.1 mg/dL 4.4 High   4.5 High    Total Protein 6.0 - 8.5 g/dL 7.3  7.4   Albumin 4.0 - 5.0  g/dL 5.0  5.0   Comment:                **Effective April 18, 2022 Albumin reference interval**                   will be changing to:                              Age                  Male          Male                             0 -   7 days       3.6 - 4.9      3.6 - 4.9                             8 -  30 days       3.5 - 4.6      3.5 - 4.6                             1 -   6 months     3.7 - 4.8      3.7 - 4.8                      7 months -   2 years      4.0 - 5.0      4.0 - 5.0                              3 -   5 years      4.1 - 5.0      4.1 - 5.0                             6 -  12 years      4.2 - 5.0      4.2 - 5.0                            13 -  30 years      4.3 - 5.2      4.0 - 5.0                            31 -  50 years      4.1 - 5.1      3.9 - 4.9                            51 -  60 years      3.8 - 4.9      3.8 - 4.9                            61 -  70 years      3.9 - 4.9      3.9 - 4.9  71 -  80 years      3.8 - 4.8      3.8 - 4.8                            81 -  89 years      3.7 - 4.7      3.7 - 4.7                            90 - 199 years      3.6 - 4.6      3.6 - 4.6   Globulin, Total 1.5 - 4.5 g/dL 2.3  2.4   Albumin/Globulin Ratio 1.2 - 2.2 2.2  2.1   Bilirubin Total 0.0 - 1.2 mg/dL 1.0  0.5   Alkaline Phosphatase 44 - 121 IU/L 56  68   LDH 121 - 224 IU/L 242 High   253 High    AST 0 - 40 IU/L 40  63 High    ALT 0 - 44 IU/L 51 High   121 High    GGT 0 - 65 IU/L 73 High   74 High    Iron 38 - 169 ug/dL 120  83   Cholesterol, Total 100 - 199 mg/dL 228 High   299 High    Triglycerides 0 - 149 mg/dL 228 High   132   HDL >39 mg/dL 45  47   VLDL Cholesterol Cal 5 - 40 mg/dL 41 High   24   LDL Chol Calc (NIH) 0 - 99 mg/dL 142 High   228 High    Chol/HDL Ratio 0.0 - 5.0 ratio 5.1 High   6.4 High  CM   Comment:                                   T. Chol/HDL Ratio                                              Men  Women                                1/2 Avg.Risk  3.4    3.3                                    Avg.Risk  5.0    4.4                                 2X Avg.Risk  9.6    7.1                                 3X Avg.Risk 23.4   11.0   Estimated CHD Risk 0.0 - 1.0 times avg. 1.0  1.3 High  CM   Comment: The CHD Risk is based on the T. Chol/HDL ratio. Other  factors affect CHD Risk such as hypertension, smoking,  diabetes, severe obesity, and family history of  premature CHD.   TSH 0.450 - 4.500 uIU/mL 2.800  1.500   T4, Total  4.5 - 12.0 ug/dL 7.1  6.8   T3 Uptake Ratio 24 - 39 % 24  25   Free Thyroxine Index 1.2 - 4.9 1.7  1.7   Prostate Specific Ag, Serum 0.0 - 4.0 ng/mL 1.5  1.2 CM   Comment: Roche ECLIA methodology.  According to the American Urological Association, Serum PSA should  decrease and remain at undetectable levels after radical  prostatectomy. The AUA defines biochemical recurrence as an initial  PSA value 0.2 ng/mL or greater followed by a subsequent confirmatory  PSA value 0.2 ng/mL or greater.  Values obtained with different assay methods or kits cannot be used  interchangeably. Results cannot be interpreted as absolute evidence  of the presence or absence of malignant disease.   WBC 3.4 - 10.8 x10E3/uL 5.2  5.3   RBC 4.14 - 5.80 x10E6/uL 5.13  4.86   Hemoglobin 13.0 - 17.7 g/dL 14.9  14.2   Hematocrit 37.5 - 51.0 % 43.3  42.0   MCV 79 - 97 fL 84  86   MCH 26.6 - 33.0 pg 29.0  29.2   MCHC 31.5 - 35.7 g/dL 34.4  33.8   RDW 11.6 - 15.4 % 14.6  14.0   Platelets 150 - 450 x10E3/uL 197  185   Neutrophils Not Estab. % 40  37   Lymphs Not Estab. % 48  50   Monocytes Not Estab. % 10  10   Eos Not Estab. % 1  2   Basos Not Estab. % 1  1   Neutrophils Absolute 1.4 - 7.0 x10E3/uL 2.1  2.0   Lymphocytes Absolute 0.7 - 3.1 x10E3/uL 2.6  2.7   Monocytes Absolute 0.1 - 0.9 x10E3/uL 0.5  0.5   EOS (ABSOLUTE) 0.0 - 0.4 x10E3/uL 0.1  0.1   Basophils Absolute 0.0 - 0.2 x10E3/uL 0.0  0.0   Immature Granulocytes Not Estab. % 0  0   Immature Grans         ____________________________________________  EKG Sinus  Rhythm  - occasional PAC    # PACs = 1. -  Negative T-waves  - Anterior ischemia.   ABNORMAL   ____________________________________________    ____________________________________________   INITIAL IMPRESSION / ASSESSMENT AND PLAN  As part of my medical decision making, I reviewed the following data within the [facility]      Discussed lab results with patient.   Patient request to continue lifestyle changes to decrease his lipid profile.  Discussed PACs noted on EKG and patient denies any heart flutters, dyspnea, or chest pain.  Advised to return to the clinic if these conditions occur.  Patient will be scheduled for left ear irrigation tomorrow.  Patient also will start a 3-day blood pressure check secondary to elevated readings today.        ____________________________________________   FINAL CLINICAL IMPRESSION Well exam with  elevated blood pressure and occasional PACs.   ED Discharge Orders     None        Note:  This document was prepared using Dragon voice recognition software and may include unintentional dictation errors.

## 2022-04-08 ENCOUNTER — Ambulatory Visit: Payer: Self-pay

## 2022-04-08 DIAGNOSIS — H6122 Impacted cerumen, left ear: Secondary | ICD-10-CM

## 2022-04-08 NOTE — Progress Notes (Signed)
Both of pt ears are free of wax. Pt tolerated well.

## 2022-04-13 ENCOUNTER — Ambulatory Visit: Payer: Self-pay

## 2022-04-13 NOTE — Progress Notes (Signed)
Here for B/P check.  Denies any c/o headache.  Stated drinking plenty of fluids and does not drink salt containing beverages.

## 2022-06-07 ENCOUNTER — Ambulatory Visit: Payer: Self-pay

## 2022-06-07 DIAGNOSIS — S50811A Abrasion of right forearm, initial encounter: Secondary | ICD-10-CM

## 2022-06-07 NOTE — Progress Notes (Signed)
States back glass of personal vehicle blew out from being so hot.  States he was walking past vehicle & touched the glass & it splattered.  Multiple superficial  scratches on inner right forearm & skin broken skin about 1 inch long.  No active bleeding.  Area cleansed with soap & water, then cleaned with 1/2 strength H2O2.  Triple antibiotic ointment applied along with non-stick dressing & coban to hold in place.  Advised to keep covered while working. Durward Parcel, PA-C checked area before dressing applied & reinforced to [facility] the need for a tetanus shot.  Tdap administered by C. Lalla Brothers, RMA  Advised to return if needed - S/Sx of infection - fever, streaking, foul smelling drainage.  Extra bandage supplies given.  AMD

## 2022-11-21 ENCOUNTER — Other Ambulatory Visit: Payer: Self-pay

## 2022-11-21 NOTE — Progress Notes (Signed)
BAT and urine drug test completed as requested by employer.

## 2023-01-19 ENCOUNTER — Encounter: Payer: Self-pay | Admitting: Physician Assistant
